# Patient Record
Sex: Male | Born: 1955 | Race: White | Hispanic: No | Marital: Married
Health system: Southern US, Community
[De-identification: ages and names within clinical notes are randomized; demographics above are authoritative.]

---

## 2008-01-07 ENCOUNTER — Ambulatory Visit: Payer: Self-pay | Admitting: Family Medicine

## 2008-01-11 ENCOUNTER — Ambulatory Visit: Payer: Self-pay | Admitting: Urology

## 2008-09-12 ENCOUNTER — Ambulatory Visit: Payer: Self-pay | Admitting: Unknown Physician Specialty

## 2010-01-23 ENCOUNTER — Other Ambulatory Visit: Payer: Self-pay | Admitting: Physician Assistant

## 2010-04-13 ENCOUNTER — Ambulatory Visit: Payer: Self-pay | Admitting: Urology

## 2010-04-17 ENCOUNTER — Ambulatory Visit: Payer: Self-pay | Admitting: Urology

## 2010-04-18 ENCOUNTER — Ambulatory Visit: Payer: Self-pay | Admitting: Urology

## 2010-04-19 ENCOUNTER — Ambulatory Visit: Payer: Self-pay | Admitting: Urology

## 2012-09-12 IMAGING — CR DG ABDOMEN 1V
1 series · 3 of 3 positions shown · non-contrast
Comparison: none

REASON FOR EXAM: kidney stonne - Send film with patient
COMMENTS:

[Series 1: view not recorded · 0.17mm/px · 3 of 3 slices shown]
[im 1/3]
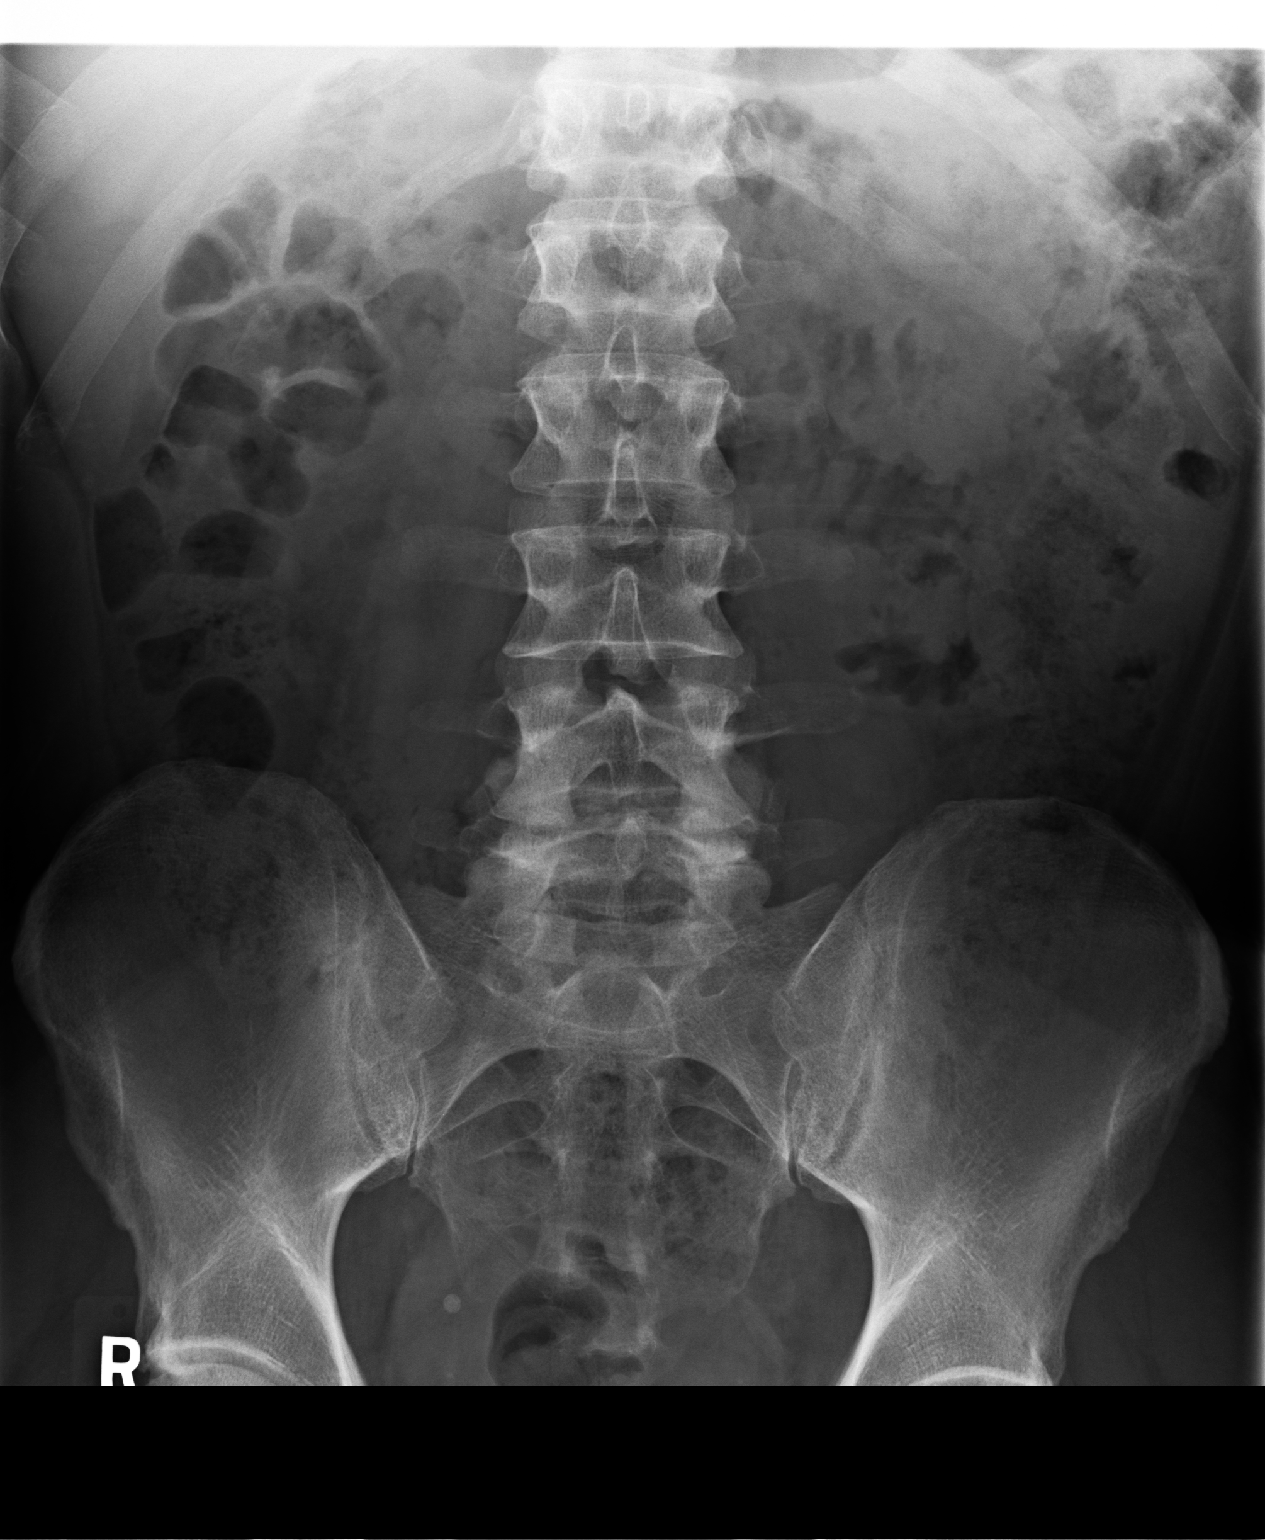
[im 2/3]
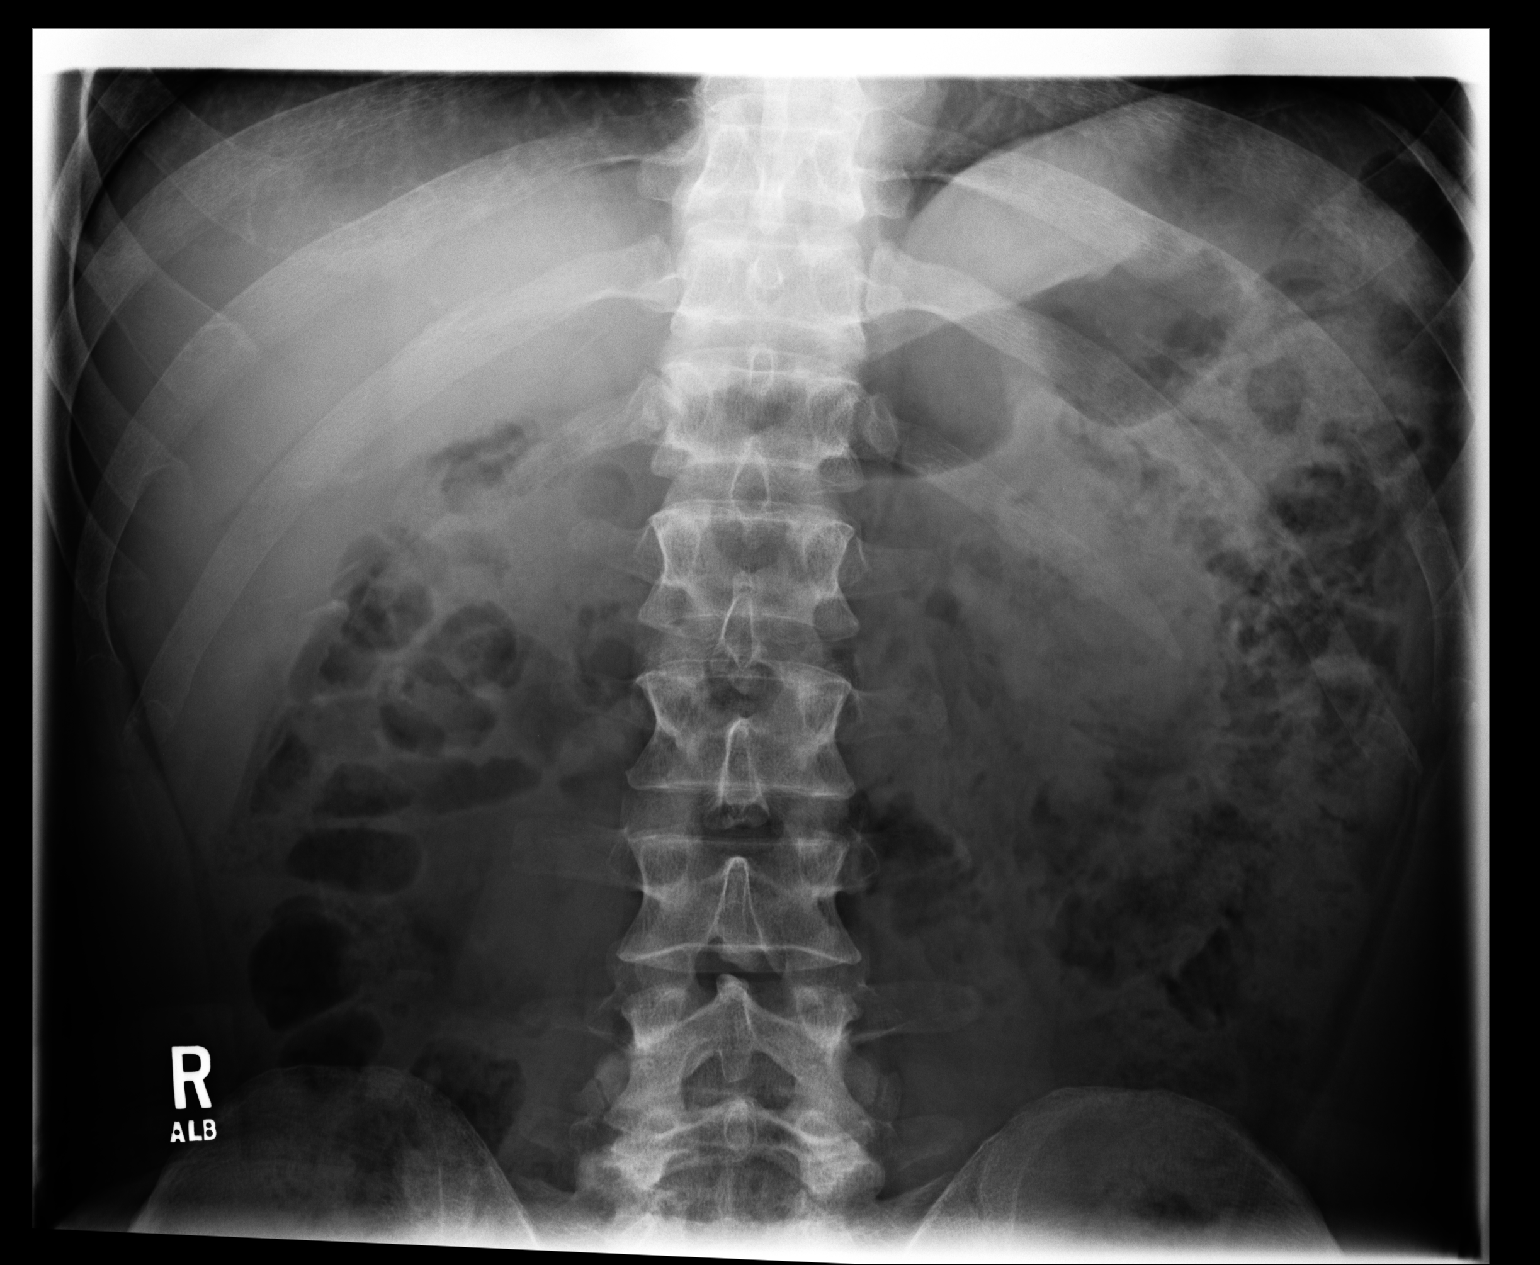
[im 3/3]
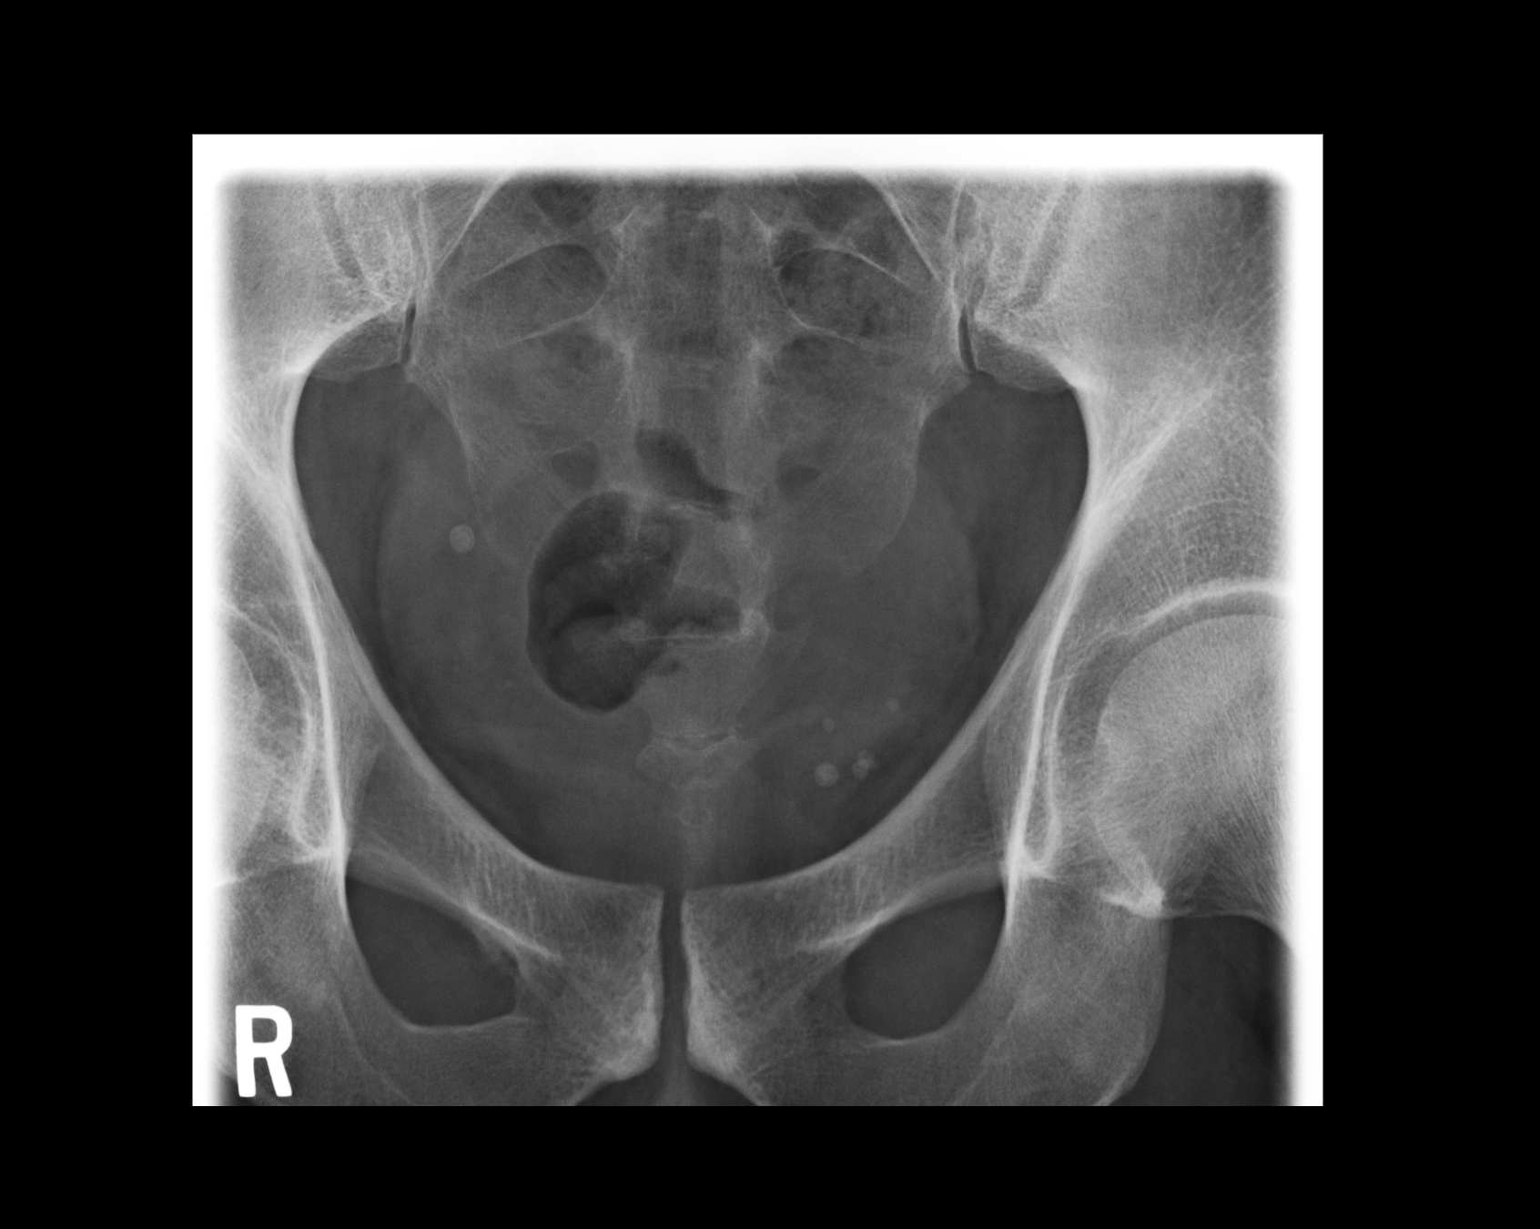

[3 of 3 positions shown; findings below may reference images not displayed]

PROCEDURE:     MDR - MDR KIDNEY URETER BLADDER  - April 18, 2010  [DATE]

RESULT:     There is a calcification projected over the inferior aspect of
the midpole of the right kidney. The finding is compatible with a right
renal stone. This has been present previously. Additional smaller stones may
also be present and obscured by the overlying bowel. No definite left renal
stones are seen. No ureteral calcifications are identified on either side.
Phleboliths are noted in the pelvis bilaterally.
IMPRESSION: 1.     Right nephrolithiasis.

## 2017-01-20 DIAGNOSIS — M25551 Pain in right hip: Secondary | ICD-10-CM | POA: Diagnosis not present

## 2017-01-20 DIAGNOSIS — M1611 Unilateral primary osteoarthritis, right hip: Secondary | ICD-10-CM | POA: Diagnosis not present

## 2017-01-28 DIAGNOSIS — M1611 Unilateral primary osteoarthritis, right hip: Secondary | ICD-10-CM | POA: Diagnosis not present

## 2017-03-10 DIAGNOSIS — Z23 Encounter for immunization: Secondary | ICD-10-CM | POA: Diagnosis not present

## 2017-03-10 DIAGNOSIS — M1611 Unilateral primary osteoarthritis, right hip: Secondary | ICD-10-CM | POA: Diagnosis not present

## 2017-04-17 DIAGNOSIS — Z859 Personal history of malignant neoplasm, unspecified: Secondary | ICD-10-CM | POA: Diagnosis not present

## 2017-04-17 DIAGNOSIS — L578 Other skin changes due to chronic exposure to nonionizing radiation: Secondary | ICD-10-CM | POA: Diagnosis not present

## 2017-04-17 DIAGNOSIS — Z86018 Personal history of other benign neoplasm: Secondary | ICD-10-CM | POA: Diagnosis not present

## 2017-04-17 DIAGNOSIS — L570 Actinic keratosis: Secondary | ICD-10-CM | POA: Diagnosis not present

## 2017-11-17 DIAGNOSIS — E782 Mixed hyperlipidemia: Secondary | ICD-10-CM | POA: Diagnosis not present

## 2017-11-17 DIAGNOSIS — Z0000 Encounter for general adult medical examination without abnormal findings: Secondary | ICD-10-CM | POA: Diagnosis not present

## 2017-11-19 DIAGNOSIS — Z0000 Encounter for general adult medical examination without abnormal findings: Secondary | ICD-10-CM | POA: Diagnosis not present

## 2017-11-19 DIAGNOSIS — E782 Mixed hyperlipidemia: Secondary | ICD-10-CM | POA: Diagnosis not present

## 2017-11-19 DIAGNOSIS — Z79899 Other long term (current) drug therapy: Secondary | ICD-10-CM | POA: Diagnosis not present

## 2018-03-21 DIAGNOSIS — Z23 Encounter for immunization: Secondary | ICD-10-CM | POA: Diagnosis not present

## 2018-04-20 DIAGNOSIS — L578 Other skin changes due to chronic exposure to nonionizing radiation: Secondary | ICD-10-CM | POA: Diagnosis not present

## 2018-04-20 DIAGNOSIS — L570 Actinic keratosis: Secondary | ICD-10-CM | POA: Diagnosis not present

## 2018-04-20 DIAGNOSIS — Z859 Personal history of malignant neoplasm, unspecified: Secondary | ICD-10-CM | POA: Diagnosis not present

## 2018-04-20 DIAGNOSIS — Z1283 Encounter for screening for malignant neoplasm of skin: Secondary | ICD-10-CM | POA: Diagnosis not present

## 2019-08-06 ENCOUNTER — Ambulatory Visit: Payer: Self-pay | Attending: Internal Medicine

## 2019-08-06 DIAGNOSIS — Z23 Encounter for immunization: Secondary | ICD-10-CM

## 2019-08-06 NOTE — Progress Notes (Signed)
   Covid-19 Vaccination Clinic  Name:  Scott Hawkins    MRN: [id number] DOB: 01/10/1956  08/06/2019  Mr. Scott Hawkins was observed post Covid-19 immunization for 15 minutes without incident. He was provided with Vaccine Information Sheet and instruction to access the V-Safe system.   Mr. Scott Hawkins was instructed to call 911 with any severe reactions post vaccine: [name] Kitchen Difficulty breathing  . Swelling of face and throat  . A fast heartbeat  . A bad rash all over body  . Dizziness and weakness   Immunizations Administered    Name Date Dose VIS Date Route   Moderna COVID-19 Vaccine 08/06/2019 12:53 PM 0.5 mL 04/13/2019 Intramuscular   Manufacturer: Moderna   Lot: [id number]   NDC: 37955-831-67

## 2019-09-03 ENCOUNTER — Ambulatory Visit: Payer: Self-pay | Attending: Internal Medicine

## 2019-09-03 DIAGNOSIS — Z23 Encounter for immunization: Secondary | ICD-10-CM

## 2019-09-03 NOTE — Progress Notes (Signed)
   Covid-19 Vaccination Clinic  Name:  Scott Hawkins    MRN: [id number] DOB: Apr 22, 1956  09/03/2019  [name] was observed post Covid-19 immunization for 15 minutes without incident. He was provided with Vaccine Information Sheet and instruction to access the V-Safe system.   [name] was instructed to call 911 with any severe reactions post vaccine: [name] Kitchen Difficulty breathing  . Swelling of face and throat  . A fast heartbeat  . A bad rash all over body  . Dizziness and weakness   Immunizations Administered    Name Date Dose VIS Date Route   Moderna COVID-19 Vaccine 09/03/2019 12:29 PM 0.5 mL 04/2019 Intramuscular   Manufacturer: Moderna   Lot: [id number]   NDC: 38466-599-35

## 2022-04-12 ENCOUNTER — Other Ambulatory Visit: Payer: Self-pay | Admitting: Internal Medicine

## 2022-04-12 DIAGNOSIS — E782 Mixed hyperlipidemia: Secondary | ICD-10-CM

## 2022-04-15 ENCOUNTER — Ambulatory Visit
Admission: RE | Admit: 2022-04-15 | Discharge: 2022-04-15 | Disposition: A | Discharge status: Home / Self Care | Payer: Self-pay | Source: Ambulatory Visit | Attending: Internal Medicine | Admitting: Internal Medicine

## 2022-04-15 DIAGNOSIS — E782 Mixed hyperlipidemia: Secondary | ICD-10-CM | POA: Insufficient documentation

## 2024-02-17 ENCOUNTER — Ambulatory Visit

## 2024-02-17 DIAGNOSIS — Z23 Encounter for immunization: Secondary | ICD-10-CM

## 2024-03-24 NOTE — Progress Notes (Signed)
 Medicare Annual Wellness Visit  Subjective:   Scott Hawkins is a 68 y.o. Male who presents for an Annual Wellness Visit. Additional concerns addressed today include: HPI  History of Present Illness Scott Hawkins is a 68 year old male who presents for an annual wellness visit.  He experiences dizziness and has been consulting an ENT specialist for this issue, with an upcoming appointment next week. He had a fall on October 7th after passing out, which he attributes to getting up too quickly. He does not use any walking aids and denies any current risk of falls.  He has a history of vertigo, which was triggered during his morning exercise routine, specifically when turning to his side for side leg lifts. He reports that the ENT performed vestibular therapy twice after his visit for vertigo. Despite this, he continues to experience dizziness, especially at night and when turning in bed. He also reports a sensation of being on a boat and has avoided driving due to the dizziness. He has not been taking any medication for vertigo recently, as previous medication caused constipation and did not alleviate his symptoms.  He mentions a history of nausea associated with the dizziness. He previously took a medication for dizziness, which he stopped due to constipation and lack of efficacy. He is unsure of the medication name but describes it as a small dissolving tablet.  He has a family history of depression, which he states has been passed down from his mother's side. His mother had multiple health issues, including heart problems, breast cancer, diabetes, and kidney failure. He notes that he has experienced depression but manages it through exercise and social activities.  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 He enjoys hiking and has plans to travel to [place] in March. He is active in his church community and  participates in a golf group.     Current Medical Providers and Suppliers: [facility] Patient Care Team: Salli Doretta Large, MD as PCP - General (Internal Medicine) Future Appointments     Date/Time Provider Department Center Visit Type   09/23/2024 2:30 PM Toche, Doretta Large, MD [facility] [place] [facility] OFFICE VISIT      ENT  Age-appropriate Screening Schedule: The list below includes current immunization status and future screening recommendations based on patient's age. Orders for these recommended tests are listed in the plan section. The patient has been provided with a written plan. Immunization History  Administered Date(s) Administered  . COVID-19 Moderna Vaccine (1st,2nd,3rd dose = 0.70ml) 08/06/2019, 09/03/2019, 04/13/2020, 05/09/2020, 12/12/2020  . Flu Vaccine IIV3, IM with Pres (29MO+)(Afluria, Fluzone) 05/30/2011  . Influenza IIV4, IM PF (6 mo+) (FLULAVAL/FLUZONE/FLUARIX QUAD) 01/26/2019  . Influenza IIV4, cell derived (Egg-Free) PF (6 mo+) (Flucelvax QUAD) 03/21/2018  . Influenza TIV (IM) 02/03/2014  . Influenza, IM unspecified 03/15/2015, 02/22/2016, 03/10/2017, 04/28/2017, 04/29/2018, 02/15/2020, 02/22/2022  . PNEUMOCOCCAL (PPSV23)(>=93YRS -OR- >=2 YRS WITH RISK) VACCINE (PNEUMOVAX 23) 10/28/2017  . RZV(>=279YR -OR-19+YRS IF  IMMCOMP) VACCINE (SHINGRIX) 02/15/2020, 04/24/2020  . TDAP (>=79YR) VACCINE (ADACEL/BOOSTRIX) 02/27/2011, 05/24/2014    Health Maintenance Topics with due status: Overdue     Topic Date Due   Pneumococcal Vaccine: 50+ 10/29/2018   Annual Physical/Well Child Check 01/10/2023   PSA 01/05/2024   TSH Level 01/06/2024   Lipid Panel 01/06/2024   COVID-19 Vaccine 01/12/2024   Health Maintenance Topics with due status: Not Due  Topic Last Completion Date   Adult Tetanus (Td And Tdap) 05/24/2014   Colorectal Cancer Screening 05/03/2020   Diabetes Screening 03/24/2024   Depression Screening 03/24/2024   RSV Immunization  Pregnant or 50+ Not Due   Health Maintenance Topics with due status: Completed     Topic Last Completion Date   Shingrix 04/24/2020   Influenza Vaccine 02/17/2024   Health Maintenance Topics with due status: Addressed     Topic Date Due   Hepatitis C Screen Addressed   Health Maintenance Topics with due status: Aged Out     Topic Date Due   Hib Vaccines Aged Out   Hepatitis A Vaccines Aged Out   Meningococcal B Vaccine Aged Out   Meningococcal ACWY Vaccine Aged Out   HPV Vaccines Aged Out    Depression Screen-PHQ2/9 completed today  PHQ-2 Over the past 2 weeks, how often have you been bothered by any of the following problems? Little interest or pleasure in doing things: Not at all Feeling down, depressed, or hopeless: Not at all Patient Health Questionnaire-2 Score: 0 PHQ-2 Over the last 2 weeks, how often have you been bothered by any of the following problems? Little interest or pleasure in doing things: Not at all Feeling down, depressed, or hopeless: Not at all Patient Health Questionnaire-2 Score: 0  PHQ-9 (if PHQ >=3)    PHQ-2 Interpretation Values between 0-3 are considered not significant for depression  PHQ-9 Interpretation and Treatment Recommendations:  0-4= None  5-9= Mild / Treatment: Support, educate to call if worse; return in one month  10-14= Moderate / Treatment: Support, watchful waiting; Antidepressant or Psychotherapy  15-19= Moderately severe / Treatment: Antidepressant OR Psychotherapy  >= 20 = Major depression, severe / Antidepressant AND Psychotherapy  Patient Health Risk Assessment questionnaire (HRA <redacted file path>): (if patient completed in MyChart or added in flowsheet)    * No data to display          Functional Ability/Safety Screen: Was the patient's timed Get Up and Go Test unsteady or longer than 30 sec? No    Cognitive Assessment: Cognitive screen used: Clock drawing. Results normal Results: The patient does not  have any evidence of any cognitive problems and denies any change in mood/affect, appearance, speech, memory or motor skills.  Identification of Risk Factors: Risk factors include: increased fall risk and Recent episode of Orthostatic hypotension and fainting)  Patient Active Problem List  Diagnosis  . Major depressive disorder, recurrent episode, mild ()  . Hyperlipidemia  . Primary osteoarthritis of right hip  . Subclinical hypothyroidism  . IGT (impaired glucose tolerance)  . Plantar fasciitis of left foot  . Calcaneal bursitis, left  . Hammer toes of both feet  . Subungual hematoma of toe of left foot  . Subungual hematoma of toe of right foot  . Toenail deformity  . Degenerative joint disease of ankle and foot, left  . Prediabetes     Outpatient Medications Prior to Visit  Medication Sig Dispense Refill  . aspirin 81 MG EC tablet Take 81 mg by mouth once daily    . cyanocobalamin (VITAMIN B12) 1000 MCG tablet Take 1,000 mcg by mouth once daily    . levothyroxine (SYNTHROID) 75 MCG tablet TAKE 1 TABLET ONCE DAILY ON EMPTY STOMACH WITH GLASS OF WATER AT LEAST 30-60 MINS BEFORE BREAKFAST. 90 tablet 1  . MELATONIN ORAL Take by mouth as needed    . multivitamin tablet Take 1 tablet by mouth once daily.    SABRA  rosuvastatin (CRESTOR) 10 MG tablet Take 1 tablet (10 mg total) by mouth once daily for 360 days 90 tablet 1   No facility-administered medications prior to visit.    Social History   Socioeconomic History  . Marital status: Married    Spouse name: Diane Mochizuki  . Number of children: 2  Occupational History    Comment: [employer]  Tobacco Use  . Smoking status: Never  . Smokeless tobacco: Never  Vaping Use  . Vaping status: Never Used  Substance and Sexual Activity  . Alcohol use: Yes    Comment: once a wk glass of wine/liquor  . Drug use: No  . Sexual activity: Yes    Partners: Female    Birth control/protection: Surgical, None  Social History Narrative    Two sons.  Fonda is a runner, broadcasting/film/video & has lived all over, including [place].  Currently (2020) back hm living w/ parents while completing his master's.   Social Drivers of Corporate Investment Banker Strain: Low Risk  (03/24/2024)   Overall Financial Resource Strain (CARDIA)   . Difficulty of Paying Living Expenses: Not hard at all  Food Insecurity: No Food Insecurity (03/24/2024)   Hunger Vital Sign   . Worried About Programme Researcher, Broadcasting/film/video in the Last Year: Never true   . Ran Out of Food in the Last Year: Never true  Transportation Needs: No Transportation Needs (03/24/2024)   PRAPARE - Transportation   . Lack of Transportation (Medical): No   . Lack of Transportation (Non-Medical): No  Housing Stability: Unknown (03/24/2024)   Housing Stability Vital Sign   . Unable to Pay for Housing in the Last Year: No   . Homeless in the Last Year: No     Family History  Problem Relation Age of Onset  . Breast cancer Mother   . Diabetes type II Mother   . Myocardial Infarction (Heart attack) Mother 91  . Depression Mother   . Kidney disease Mother   . High blood pressure (Hypertension) Mother   . Coronary Artery Disease (Blocked arteries around heart) Mother        CABG x5  . Hyperlipidemia (Elevated cholesterol) Mother   . Skin cancer Father   . Melanoma Father 11  . Hyperlipidemia (Elevated cholesterol) Brother   . Skin cancer Brother   . Obesity Brother   . Coronary Artery Disease (Blocked arteries around heart) Brother        s/p stents x2.  CABG x2 at age 46.  SABRA Hyperlipidemia (Elevated cholesterol) Brother   . Ulcerative colitis Son   . Hyperlipidemia (Elevated cholesterol) Son   . Depression Son      Past Medical History:  Diagnosis Date  . Chronic pain of left heel    Saw Dr. Lilli for suspected plantar fascitis. Orthotic, stretches, and always wears tennis shoes.   SABRA COVID-19 10/27/2020   w/ subsequent bronchtis.  . DDD (degenerative disc disease), lumbar    L4-5  .  Hyperlipidemia   . IGT (impaired glucose tolerance) 01/04/2022   a1c 6.2%  . Kidney stones   . Major depressive disorder, recurrent episode, mild () 05/24/2014  . Primary osteoarthritis of right hip 2019  . Squamous cell cancer of skin of nose    with excision 2015  . Subclinical hypothyroidism 2019  . Vertigo 2020   Positional.  Seen by ENT, but unable to reproduce to determine side.     Review of Systems  Objective:   Vitals:  03/24/24 0839  BP: 116/60  Pulse: 72  SpO2: 99%  Weight: 89.4 kg (197 lb)  Height: 185.4 cm (6' 1)  PainSc: 0-No pain   Body mass index is 25.99 kg/m. Home vitals:    Physical Exam  GENERAL: Pleasant well appearing male, is fully alert, oriented and in no acute distress.  HEENT:  NCAT HEENT LUNGS:  breathing comfortably on RA CARDIAC:  Regular rate   EXTREMITIES:  Full range of motion with no erythema, heat or effusion.  No cyanosis, clubbing or edema noted.  NEUROLOGIC: The patient is alert and oriented.  Cranial nerves II-XII intact.  Motor and sensory examinations within normal limits.  Gait normal.     Physical Exam HEENT: Ears canal anatomy slightly different, canal deviates a little more upwards. Ears completely clean, no cerumen, normal appearing TM    Assessment/Plan:     Patient Self-Management and Personalized Health Advice The patient has been provided with information about: exercise and fall prevention  During the course of the visit the patient was educated and counseled about appropriate screening and preventive services including:  Influenza vaccine Fall Risk assessment done Exercise counseling provided  The patient's BMI is in the acceptable range  Diagnoses and all orders for this visit:  Subclinical hypothyroidism -     Thyroid Stimulating-Hormone (TSH)  Depression screening (Z13.31) -     Depression Screen -(PHQ- 2/9, BDI)  Prediabetes -     Hemoglobin A1C -     Basic Metabolic Panel (BMP) -     CBC  w/auto Differential (3 Part)  Mixed hyperlipidemia -     Lipid Panel w/calc LDL  Degenerative joint disease of ankle and foot, left  Encounter for vitamin deficiency screening -     Vitamin D, 25-Hydroxy - [facility] -     Vitamin B12  Other orders -     diazePAM (VALIUM) 2 MG tablet; Take 1 tablet (2 mg total) by mouth every 12 (twelve) hours as needed for Anxiety for up to 15 days    Fall Risk assessment done Fall Risk plan of care done The following recommendations were made and provided to the patient:   -Make sure there is plenty of light and use nightlights in bedroom, hall & bathroom   -Avoid using slippery throw rugs, tape loose carpet to the floor, or remove the throw rug   -Keep stairs and walkways clear of clutter and telephone or electrical cords   -Even at home, wear well-fitting sturdy shoes with a flat, thin sole and a low heel and cover the entire foot.  Avoid wearing slippers   -Drink plenty of water throughout the day - aim for 1/2 cup every 1/2 hr first 8 hrs of the day   -Take time when changing positions such as going from sitting to standing. Clench your fists or pump your ankles before moving   -Bring all of your medications to every appointment with every health care provider   -Avoid ice or slippery floors   -Avoid sedating over the counter medicines and alcohol   -Have your vision checked yearly   -Stay active and walk regularly  Assessment & Plan Adult Wellness Visit Annual wellness visit conducted as required by Medicare. Mood is good, memory is age-appropriate, and no current risk of falls. No use of walking cane. Recent fall due to orthostatic hypotension. No current depression symptoms, likely due to regular exercise and socialization. 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 Previous  vestibular therapy confirmed right-sided involvement. Current symptoms include residual dizziness and nausea. Meclizine previously caused constipation and was ineffective. Valium considered for severe episodes, with caution regarding potential addiction and sedation. Discussed the use of Valium for severe vertigo, emphasizing the need to avoid driving or operating machinery due to sedative effects. - Prescribed Diazepam (Valium) 2-4mg  daily prn for severe vertigo episodes, with instructions to avoid driving or operating machinery - Advised use of meclizine for vertigo if not previously used - Recommended Senna or Miralax for constipation management  Hyperlipidemia Cholesterol levels were good last year. Routine annual check-up planned. - Ordered cholesterol panel  Subclinical hypothyroidism Thyroid function was normal last year, with a slightly elevated TSH two years ago. Routine check-up planned. - Ordered thyroid function tests  Prediabetes A1c was slightly elevated last year but not concerning. Limited exercise due to vertigo. - Ordered A1c test  Constipation Noted as a side effect of previous vertigo medication. Discussed management options including Senna and Miralax. - Recommended Senna tablets, one or two tablets daily as needed - Advised use of Miralax, one full cap's powder twice daily if needed    For split visits please select--Optional Coding for video or office visit. Time or MDM for new OR established pts (Optional)--will disappear if none selected: May 02, 2020 E&M) This visit was coded based on time. I spent a total of 45 minutes in both face-to-face and non-face-to-face activities for this visit on the date of this encounter. This time did not include the time spent on the wellness exam.  6 MONTHS FOLLOW UP  Future Appointments     Date/Time Provider Department Center Visit Type   09/23/2024 2:30 PM Toche, Doretta Large, MD [facility] [place] [facility] OFFICE  VISIT       An after visit summary was provided for the patient either in written format or through MyChart *Some images could not be shown.

## 2024-04-06 ENCOUNTER — Other Ambulatory Visit: Payer: Self-pay | Admitting: Student

## 2024-04-06 DIAGNOSIS — R42 Dizziness and giddiness: Secondary | ICD-10-CM

## 2024-04-13 ENCOUNTER — Other Ambulatory Visit: Payer: Self-pay | Admitting: Student

## 2024-04-13 ENCOUNTER — Inpatient Hospital Stay: Admission: RE | Admit: 2024-04-13 | Source: Ambulatory Visit

## 2024-04-13 DIAGNOSIS — R42 Dizziness and giddiness: Secondary | ICD-10-CM

## 2024-04-15 ENCOUNTER — Ambulatory Visit

## 2024-04-28 ENCOUNTER — Encounter: Payer: Self-pay | Admitting: Student

## 2024-05-12 ENCOUNTER — Ambulatory Visit
Admission: RE | Admit: 2024-05-12 | Discharge: 2024-05-12 | Disposition: A | Discharge status: Home / Self Care | Source: Ambulatory Visit | Attending: Student | Admitting: Student

## 2024-05-12 DIAGNOSIS — R42 Dizziness and giddiness: Secondary | ICD-10-CM

## 2024-05-12 MED ORDER — GADOPICLENOL 0.5 MMOL/ML IV SOLN
10.0000 mL | Freq: Once | INTRAVENOUS | Status: AC | PRN
  Administered 2024-05-12: 10 mL via INTRAVENOUS
